# Patient Record
Sex: Female | Born: 1965 | Race: White | Hispanic: No | Smoking: Never smoker
Health system: Southern US, Community
[De-identification: ages and names within clinical notes are randomized; demographics above are authoritative.]

---

## 1998-02-22 ENCOUNTER — Other Ambulatory Visit: Admission: RE | Admit: 1998-02-22 | Discharge: 1998-02-22 | Payer: Self-pay | Admitting: *Deleted

## 2000-05-30 ENCOUNTER — Other Ambulatory Visit: Admission: RE | Admit: 2000-05-30 | Discharge: 2000-05-30 | Payer: Self-pay | Admitting: Gynecology

## 2001-06-09 ENCOUNTER — Other Ambulatory Visit: Admission: RE | Admit: 2001-06-09 | Discharge: 2001-06-09 | Payer: Self-pay | Admitting: Gynecology

## 2003-03-30 ENCOUNTER — Other Ambulatory Visit: Admission: RE | Admit: 2003-03-30 | Discharge: 2003-03-30 | Payer: Self-pay | Admitting: Gynecology

## 2006-11-11 ENCOUNTER — Other Ambulatory Visit: Admission: RE | Admit: 2006-11-11 | Discharge: 2006-11-11 | Payer: Self-pay | Admitting: Gynecology

## 2007-01-26 ENCOUNTER — Encounter: Admission: RE | Admit: 2007-01-26 | Discharge: 2007-01-26 | Payer: Self-pay | Admitting: Gynecology

## 2007-04-06 ENCOUNTER — Emergency Department (HOSPITAL_COMMUNITY): Admission: EM | Admit: 2007-04-06 | Discharge: 2007-04-06 | Payer: Self-pay | Admitting: Emergency Medicine

## 2010-01-01 ENCOUNTER — Encounter: Admission: RE | Admit: 2010-01-01 | Discharge: 2010-01-01 | Payer: Self-pay | Admitting: Gynecology

## 2011-07-31 LAB — POCT URINALYSIS DIP (DEVICE)
Glucose, UA: NEGATIVE
Hgb urine dipstick: NEGATIVE
Ketones, ur: NEGATIVE
Protein, ur: 30 — AB
Urobilinogen, UA: 0.2

## 2015-12-15 ENCOUNTER — Other Ambulatory Visit: Payer: Self-pay | Admitting: Physician Assistant

## 2015-12-15 DIAGNOSIS — M79604 Pain in right leg: Secondary | ICD-10-CM

## 2015-12-19 ENCOUNTER — Ambulatory Visit
Admission: RE | Admit: 2015-12-19 | Discharge: 2015-12-19 | Disposition: A | Discharge status: Home / Self Care | Payer: Commercial Managed Care - PPO | Source: Ambulatory Visit | Attending: Physician Assistant | Admitting: Physician Assistant

## 2015-12-19 DIAGNOSIS — M79604 Pain in right leg: Secondary | ICD-10-CM

## 2016-07-05 ENCOUNTER — Encounter: Payer: Self-pay | Admitting: Family Medicine

## 2017-03-06 DIAGNOSIS — H43393 Other vitreous opacities, bilateral: Secondary | ICD-10-CM | POA: Diagnosis not present

## 2017-07-29 DIAGNOSIS — L821 Other seborrheic keratosis: Secondary | ICD-10-CM | POA: Diagnosis not present

## 2017-07-29 DIAGNOSIS — B078 Other viral warts: Secondary | ICD-10-CM | POA: Diagnosis not present

## 2017-07-29 DIAGNOSIS — Z23 Encounter for immunization: Secondary | ICD-10-CM | POA: Diagnosis not present

## 2017-07-29 DIAGNOSIS — D225 Melanocytic nevi of trunk: Secondary | ICD-10-CM | POA: Diagnosis not present

## 2017-08-28 DIAGNOSIS — Z0000 Encounter for general adult medical examination without abnormal findings: Secondary | ICD-10-CM | POA: Diagnosis not present

## 2017-08-28 DIAGNOSIS — Z131 Encounter for screening for diabetes mellitus: Secondary | ICD-10-CM | POA: Diagnosis not present

## 2017-08-28 DIAGNOSIS — M7989 Other specified soft tissue disorders: Secondary | ICD-10-CM | POA: Diagnosis not present

## 2017-08-28 DIAGNOSIS — Z13220 Encounter for screening for lipoid disorders: Secondary | ICD-10-CM | POA: Diagnosis not present

## 2017-08-28 DIAGNOSIS — Z23 Encounter for immunization: Secondary | ICD-10-CM | POA: Diagnosis not present

## 2017-08-29 ENCOUNTER — Encounter: Payer: Self-pay | Admitting: Gastroenterology

## 2017-09-15 DIAGNOSIS — B078 Other viral warts: Secondary | ICD-10-CM | POA: Diagnosis not present

## 2017-10-16 ENCOUNTER — Other Ambulatory Visit: Payer: Self-pay

## 2017-10-16 DIAGNOSIS — I83813 Varicose veins of bilateral lower extremities with pain: Secondary | ICD-10-CM

## 2017-10-23 ENCOUNTER — Encounter: Payer: Commercial Managed Care - PPO | Admitting: Gastroenterology

## 2017-11-06 ENCOUNTER — Encounter: Payer: Self-pay | Admitting: Family Medicine

## 2017-12-09 ENCOUNTER — Ambulatory Visit: Payer: Commercial Managed Care - PPO | Admitting: Vascular Surgery

## 2017-12-09 ENCOUNTER — Ambulatory Visit (HOSPITAL_COMMUNITY)
Admission: RE | Admit: 2017-12-09 | Discharge: 2017-12-09 | Disposition: A | Discharge status: Home / Self Care | Payer: Commercial Managed Care - PPO | Source: Ambulatory Visit | Attending: Vascular Surgery | Admitting: Vascular Surgery

## 2017-12-09 ENCOUNTER — Encounter: Payer: Self-pay | Admitting: Vascular Surgery

## 2017-12-09 VITALS — BP 141/75 | HR 84 | Resp 18 | Ht 69.0 in | Wt 164.0 lb

## 2017-12-09 DIAGNOSIS — M7989 Other specified soft tissue disorders: Secondary | ICD-10-CM | POA: Diagnosis not present

## 2017-12-09 DIAGNOSIS — I83813 Varicose veins of bilateral lower extremities with pain: Secondary | ICD-10-CM | POA: Insufficient documentation

## 2017-12-09 DIAGNOSIS — R600 Localized edema: Secondary | ICD-10-CM | POA: Insufficient documentation

## 2017-12-09 NOTE — Progress Notes (Signed)
Subjective:     Patient ID: Sheri Maxwell, female   DOB: 1966-02-18, 52 y.o.   MRN: [id number]  HPI This 52 year old female was referred for evaluation of bilateral ankle swelling and tenderness. She states that this has been present for about 10 years. She has no history of DVT thrombophlebitis stasis ulcers or bleeding. She denies any varicose veins. She states that she does have a bulge behind her right thigh which is not tender and has been present for 15 years which she thinks is a vein. She does not have pain in her lower extremities but her ankles are sensitive to touch.  History reviewed. No pertinent past medical history.  Social History   Tobacco Use  . Smoking status: Never Smoker  . Smokeless tobacco: Never Used  Substance Use Topics  . Alcohol use: Yes    Family History  Problem Relation Age of Onset  . Heart disease Mother     Not on File  No current outpatient medications on file.  Vitals:   12/09/17 1353  BP: (!) 141/75  Pulse: 84  Resp: 18  SpO2: 99%  Weight: 164 lb (74.4 kg)  Height: 5\' 9"  (1.753 m)    Body mass index is 24.22 kg/m.         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 All other systems are completely negative this healthy patient    Objective:   Physical Exam BP (!) 141/75 (BP Location: Left Arm, Patient Position: Sitting, Cuff Size: Normal)   Pulse 84   Resp 18   Ht 5\' 9"  (1.753 m)   Wt 164 lb (74.4 kg)   SpO2 99%   BMI 24.22 kg/m     Gen.-alert and oriented x3 in no apparent distress HEENT normal for age Lungs no rhonchi or wheezing Cardiovascular regular rhythm no murmurs carotid pulses 3+ palpable no bruits audible Abdomen soft nontender no palpable masses Musculoskeletal free of  major deformities Skin clear -no rashes Neurologic normal Lower extremities 3+ femoral and dorsalis pedis pulses palpable bilaterally with no edema noted Small nodule palpable in distal thigh  right posteriorly which does not appear to be a varicose vein. It is nontender. Surveillance with the ultrasound did not reveal a lumen or the appearance of a varicosity. No hyperpigmentation or ulceration noted bilaterally and no bulging varicosities noted.  Today our venous duplex exam both lower extremities which I reviewed and interpreted. There is no DVT. There are a few areas of mild reflux in the superficial system but no large caliber vein accompanies this and it is not felt to be pertinent       Assessment:     Complaint of mild bilateral ankle edema and tenderness-etiology unknown No evidence of significant arterial or venous insufficiency History of ruptured cervical disc requiring surgery    Plan:     Patient does not have objective findings of edema on physical exam and do not know the etiology of her sensitivity in the ankle area No indication for any arterial or venous intervention or further evaluation No further suggestions

## 2017-12-10 ENCOUNTER — Encounter: Payer: Self-pay | Admitting: Family Medicine

## 2017-12-24 DIAGNOSIS — L821 Other seborrheic keratosis: Secondary | ICD-10-CM | POA: Diagnosis not present

## 2017-12-24 DIAGNOSIS — L814 Other melanin hyperpigmentation: Secondary | ICD-10-CM | POA: Diagnosis not present

## 2017-12-24 DIAGNOSIS — D1801 Hemangioma of skin and subcutaneous tissue: Secondary | ICD-10-CM | POA: Diagnosis not present

## 2018-06-24 ENCOUNTER — Other Ambulatory Visit: Payer: Self-pay | Admitting: Family Medicine

## 2018-06-24 DIAGNOSIS — Z1231 Encounter for screening mammogram for malignant neoplasm of breast: Secondary | ICD-10-CM

## 2018-07-14 DIAGNOSIS — L821 Other seborrheic keratosis: Secondary | ICD-10-CM | POA: Diagnosis not present

## 2018-07-14 DIAGNOSIS — D485 Neoplasm of uncertain behavior of skin: Secondary | ICD-10-CM | POA: Diagnosis not present

## 2018-07-14 DIAGNOSIS — Z23 Encounter for immunization: Secondary | ICD-10-CM | POA: Diagnosis not present

## 2018-07-21 ENCOUNTER — Ambulatory Visit: Payer: Commercial Managed Care - PPO

## 2018-07-23 ENCOUNTER — Ambulatory Visit
Admission: RE | Admit: 2018-07-23 | Discharge: 2018-07-23 | Disposition: A | Discharge status: Home / Self Care | Payer: Commercial Managed Care - PPO | Source: Ambulatory Visit | Attending: Family Medicine | Admitting: Family Medicine

## 2018-07-23 DIAGNOSIS — Z1231 Encounter for screening mammogram for malignant neoplasm of breast: Secondary | ICD-10-CM | POA: Diagnosis not present

## 2018-11-24 DIAGNOSIS — L609 Nail disorder, unspecified: Secondary | ICD-10-CM | POA: Diagnosis not present

## 2018-11-24 DIAGNOSIS — L603 Nail dystrophy: Secondary | ICD-10-CM | POA: Diagnosis not present

## 2018-11-24 DIAGNOSIS — Z23 Encounter for immunization: Secondary | ICD-10-CM | POA: Diagnosis not present

## 2019-03-23 ENCOUNTER — Other Ambulatory Visit: Payer: Self-pay | Admitting: Family Medicine

## 2019-03-23 ENCOUNTER — Other Ambulatory Visit (HOSPITAL_COMMUNITY)
Admission: RE | Admit: 2019-03-23 | Discharge: 2019-03-23 | Disposition: A | Discharge status: Home / Self Care | Payer: Commercial Managed Care - PPO | Source: Ambulatory Visit | Attending: Family Medicine | Admitting: Family Medicine

## 2019-03-23 DIAGNOSIS — Z124 Encounter for screening for malignant neoplasm of cervix: Secondary | ICD-10-CM | POA: Insufficient documentation

## 2019-03-26 LAB — CYTOLOGY - PAP: Diagnosis: NEGATIVE

## 2021-06-12 ENCOUNTER — Encounter: Payer: Self-pay | Admitting: Podiatry

## 2021-06-12 ENCOUNTER — Ambulatory Visit: Payer: Commercial Managed Care - PPO | Admitting: Podiatry

## 2021-06-12 ENCOUNTER — Telehealth: Payer: Self-pay

## 2021-06-12 ENCOUNTER — Other Ambulatory Visit: Payer: Self-pay

## 2021-06-12 DIAGNOSIS — L309 Dermatitis, unspecified: Secondary | ICD-10-CM

## 2021-06-12 DIAGNOSIS — W57XXXS Bitten or stung by nonvenomous insect and other nonvenomous arthropods, sequela: Secondary | ICD-10-CM | POA: Diagnosis not present

## 2021-06-12 DIAGNOSIS — N951 Menopausal and female climacteric states: Secondary | ICD-10-CM | POA: Insufficient documentation

## 2021-06-12 DIAGNOSIS — N912 Amenorrhea, unspecified: Secondary | ICD-10-CM | POA: Insufficient documentation

## 2021-06-12 DIAGNOSIS — B351 Tinea unguium: Secondary | ICD-10-CM | POA: Diagnosis not present

## 2021-06-12 MED ORDER — TERBINAFINE HCL 250 MG PO TABS: 250.0000 mg | ORAL_TABLET | Freq: Every day | ORAL | 0 refills | Status: DC

## 2021-06-12 MED ORDER — FLUOCINONIDE 0.05 % EX OINT: 1.0000 "application " | TOPICAL_OINTMENT | Freq: Two times a day (BID) | CUTANEOUS | 0 refills | Status: AC

## 2021-06-12 MED ORDER — EFINACONAZOLE 10 % EX SOLN: 1.0000 [drp] | Freq: Every day | CUTANEOUS | 11 refills | Status: DC

## 2021-06-12 NOTE — Telephone Encounter (Signed)
Toenail specimen mailed to [facility] for fungal culture

## 2021-06-13 NOTE — Progress Notes (Signed)
  Subjective:  Patient ID: Sheri Maxwell, female    DOB: 04/17/1966,  MRN: [id number]  Chief Complaint  Patient presents with   Nail Problem    (np) dsicoloration of toenail     55 y.o. female presents with the above complaint. History confirmed with patient.  She has loosening of both nail plates and discoloration, she had a culture done at another practice and was told there is no nail fungus in there and could not treat this.  She also has 2 spots on the outside of her left foot near the fourth and fifth toes from fire ant bites from a wedding  Objective:  Physical Exam: warm, good capillary refill, no trophic changes or ulcerative lesions, normal DP and PT pulses, and normal sensory exam.  Bilateral hallux nail has yellow discoloration, brown subungual debris and onycholysis.  Clinically appears consistent with onychomycosis.  2 small hyperpigmented scars on around the fourth and fifth toes left foot, nonraised nontender nondraining     Assessment:   1. Onychomycosis   2. Dermatitis   3. Arthropod bite, sequela      Plan:  Patient was evaluated and treated and all questions answered.  Discussed etiology and treatment options for both onycholysis onychodystrophy and onychomycosis.  I recommended repeat culture of the nail plate today with a good sample from the most proximal portion of the raised portion of the subungual debris on the right hallux.  Clinically it does appear to be consistent with onychomycosis and I think it would be worth treating empirically with terbinafine which I prescribed for her.  Also recommended dual therapy with Jublia as well which I also prescribed.  Photographs are taken.  The bug bites are persistent but are minimally painful.  I recommended topical corticosteroid and this should alleviate this.  Rx for fluocinonide ointment sent to pharmacy  Return in about 4 months (around 10/12/2021) for follow up after nail fungus treatment.

## 2021-06-15 ENCOUNTER — Telehealth: Payer: Self-pay | Admitting: Podiatry

## 2021-06-15 NOTE — Telephone Encounter (Signed)
Patient said the Efinaconazole is being denied by her insurance. Is there something else you can call in?

## 2021-06-19 MED ORDER — CICLOPIROX 8 % EX SOLN
Freq: Every day | CUTANEOUS | 0 refills | Status: AC
Start: 2021-06-19 — End: ?

## 2021-06-19 NOTE — Addendum Note (Signed)
Addended bySherryle Lis, Abrian Hanover R on: 06/19/2021 05:47 PM   Modules accepted: Orders

## 2021-07-17 ENCOUNTER — Telehealth: Payer: Self-pay | Admitting: *Deleted

## 2021-07-17 NOTE — Telephone Encounter (Signed)
Patient is calling and wanted to know if she should refill the Terbinafine -250 mg tablet or just the topical before f/u upcoming appointment(10/23/21)? Please advise.

## 2021-07-19 NOTE — Telephone Encounter (Signed)
Returned call to patient,no answer, left message per Dr Maxie Barb recommendations to continue with both medication,90 days total.

## 2021-09-27 ENCOUNTER — Other Ambulatory Visit: Payer: Self-pay | Admitting: Podiatry

## 2021-09-27 DIAGNOSIS — B351 Tinea unguium: Secondary | ICD-10-CM

## 2021-09-27 NOTE — Telephone Encounter (Signed)
Please advise 

## 2021-10-23 ENCOUNTER — Other Ambulatory Visit: Payer: Self-pay

## 2021-10-23 ENCOUNTER — Telehealth: Payer: Self-pay | Admitting: *Deleted

## 2021-10-23 ENCOUNTER — Other Ambulatory Visit: Payer: Self-pay | Admitting: Podiatry

## 2021-10-23 ENCOUNTER — Ambulatory Visit: Payer: Commercial Managed Care - PPO | Admitting: Podiatry

## 2021-10-23 DIAGNOSIS — B351 Tinea unguium: Secondary | ICD-10-CM | POA: Diagnosis not present

## 2021-10-23 MED ORDER — JUBLIA 10 % EX SOLN: 1.0000 [drp] | Freq: Every day | CUTANEOUS | 5 refills | Status: DC

## 2021-10-23 MED ORDER — JUBLIA 10 % EX SOLN: 1.0000 [drp] | Freq: Every day | CUTANEOUS | 5 refills | Status: AC

## 2021-10-23 MED ORDER — TERBINAFINE HCL 250 MG PO TABS: ORAL_TABLET | ORAL | 0 refills | Status: DC

## 2021-10-23 NOTE — Telephone Encounter (Signed)
Patient is calling because she would like the Jublia sent to [facility] instead of [facility] and terbinafine is not able to be filled yet according pharmacy.

## 2021-10-23 NOTE — Telephone Encounter (Signed)
Resending it now

## 2021-10-25 ENCOUNTER — Encounter: Payer: Self-pay | Admitting: Podiatry

## 2021-10-25 NOTE — Progress Notes (Signed)
°  Subjective:  Patient ID: Sheri Maxwell, female    DOB: February 07, 1966,  MRN: [id number]  Chief Complaint  Patient presents with   Nail Problem     F/U nail fungus -pt states," getting bette, but not 100%, still there and not growing properly." - Tx: terbinafine, and jublia ( no reactions) -no redness/swelling/pain    Insect Bite    F/U LT bug bit -pt states much better, and less redness tx: fluocinonide     56 y.o. female returns for follow-up with the above complaint. History confirmed with patient.  She has had quite a bit of improvement with Jublia and terbinafine  Objective:  Physical Exam: warm, good capillary refill, no trophic changes or ulcerative lesions, normal DP and PT pulses, and normal sensory exam.  At last visit the nail was involved 50% now less than 30%        Assessment:   1. Onychomycosis      Plan:  Patient was evaluated and treated and all questions answered.  Has had quite a bit of improvement Lamisil and Jublia applications.  I think we should continue therapy at this point to eradicate this.  Further refills were sent to her pharmacy.  New photographs were taken.  I will see her back in 4 months.  No follow-ups on file.

## 2021-10-29 NOTE — Telephone Encounter (Signed)
Notified patient thru vmessage.

## 2021-11-16 ENCOUNTER — Other Ambulatory Visit: Payer: Self-pay | Admitting: Family Medicine

## 2021-11-16 DIAGNOSIS — Z1231 Encounter for screening mammogram for malignant neoplasm of breast: Secondary | ICD-10-CM

## 2021-11-23 ENCOUNTER — Encounter: Payer: Self-pay | Admitting: Podiatry

## 2021-11-27 ENCOUNTER — Ambulatory Visit
Admission: RE | Admit: 2021-11-27 | Discharge: 2021-11-27 | Disposition: A | Discharge status: Home / Self Care | Payer: Commercial Managed Care - PPO | Source: Ambulatory Visit | Attending: Family Medicine | Admitting: Family Medicine

## 2021-11-27 ENCOUNTER — Other Ambulatory Visit: Payer: Self-pay

## 2021-11-27 DIAGNOSIS — Z1231 Encounter for screening mammogram for malignant neoplasm of breast: Secondary | ICD-10-CM

## 2022-01-11 ENCOUNTER — Other Ambulatory Visit: Payer: Self-pay | Admitting: Podiatry

## 2022-01-11 DIAGNOSIS — B351 Tinea unguium: Secondary | ICD-10-CM

## 2022-02-12 ENCOUNTER — Ambulatory Visit: Payer: Commercial Managed Care - PPO | Admitting: Podiatry

## 2022-02-12 DIAGNOSIS — B351 Tinea unguium: Secondary | ICD-10-CM

## 2022-02-12 MED ORDER — TERBINAFINE HCL 250 MG PO TABS: ORAL_TABLET | ORAL | 0 refills | Status: AC

## 2022-02-13 NOTE — Progress Notes (Signed)
?  Subjective:  ?Patient ID: Sheri Maxwell, female    DOB: 07-21-1966,  MRN: [id number] ? ?Chief Complaint  ?Patient presents with  ? Nail Problem  ?  follow up after nail fungus treatment.  ? ? ?56 y.o. female returns for follow-up with the above complaint. History confirmed with patient.  She has had quite a bit of improvement with Penlac and terbinafine ? ?Objective:  ?Physical Exam: ?warm, good capillary refill, no trophic changes or ulcerative lesions, normal DP and PT pulses, and normal sensory exam.  Approximate 20% involvement ? ?Assessment:  ? ?1. Onychomycosis   ? ? ? ?Plan:  ?Patient was evaluated and treated and all questions answered. ? ?Has had quite a bit of improvement Lamisil and Jublia applications.  I think we should continue therapy at this point to eradicate this.  Refill of the terbinafine was sent.  We also discussed laser treatment and she will be scheduled for this as well. ? ?Return in about 4 months (around 06/15/2022) for follow up after nail fungus treatment.  ? ? ?

## 2022-03-08 ENCOUNTER — Other Ambulatory Visit: Payer: Commercial Managed Care - PPO

## 2022-06-18 ENCOUNTER — Ambulatory Visit: Payer: Commercial Managed Care - PPO | Admitting: Podiatry

## 2022-07-16 ENCOUNTER — Ambulatory Visit: Payer: Commercial Managed Care - PPO | Admitting: Podiatry

## 2022-08-01 ENCOUNTER — Ambulatory Visit: Payer: Commercial Managed Care - PPO | Admitting: Podiatry

## 2022-08-23 ENCOUNTER — Other Ambulatory Visit: Payer: Commercial Managed Care - PPO

## 2022-10-06 IMAGING — MG MM DIGITAL SCREENING BILAT W/ TOMO AND CAD
8 series · 9 of 24 positions shown · non-contrast
Comparison: Previous exam(s).

CLINICAL DATA: Screening.

EXAM:
DIGITAL SCREENING BILATERAL MAMMOGRAM WITH TOMOSYNTHESIS AND CAD
TECHNIQUE: Bilateral screening digital craniocaudal and mediolateral oblique
mammograms were obtained. Bilateral screening digital breast
tomosynthesis was performed. The images were evaluated with
computer-aided detection.

[L CC synth-2D]
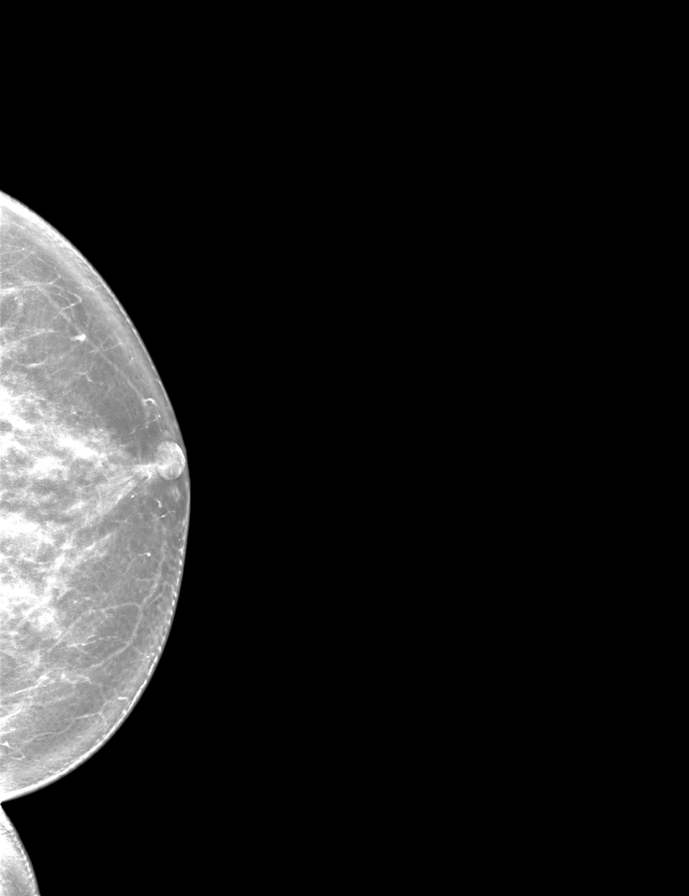

[R CC synth-2D]
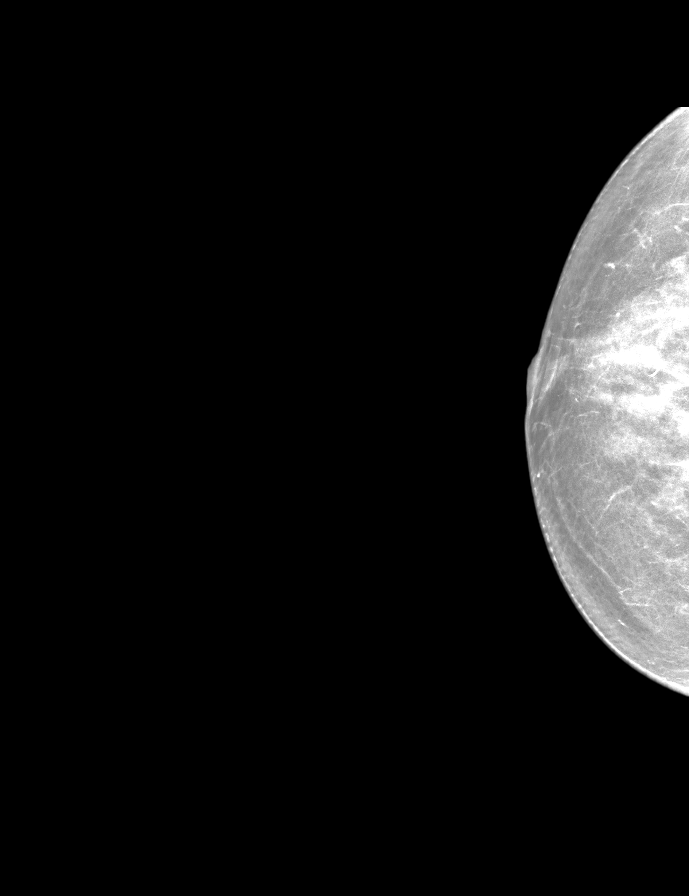

[L MLO synth-2D]
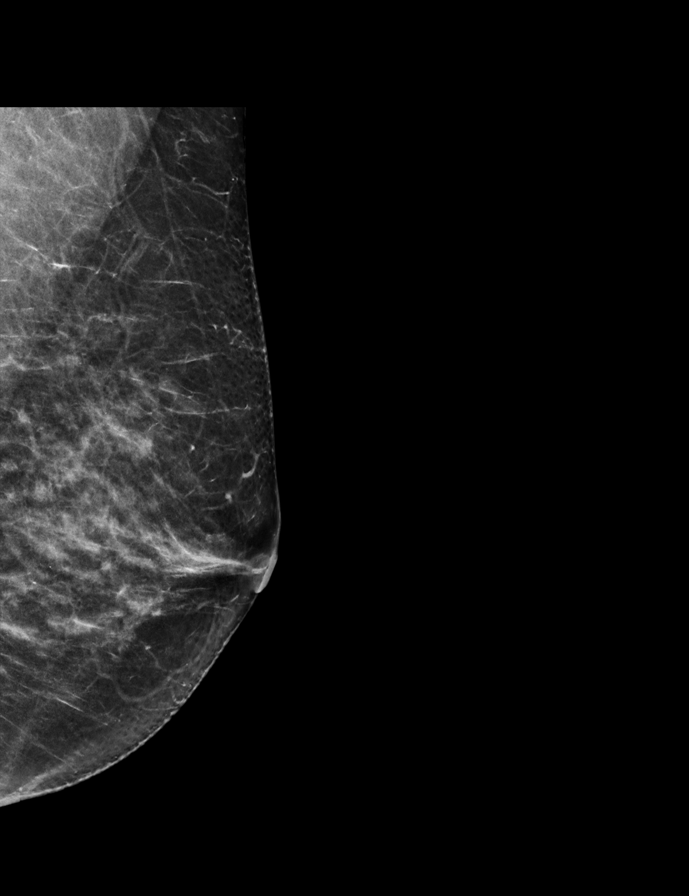

[R MLO synth-2D]
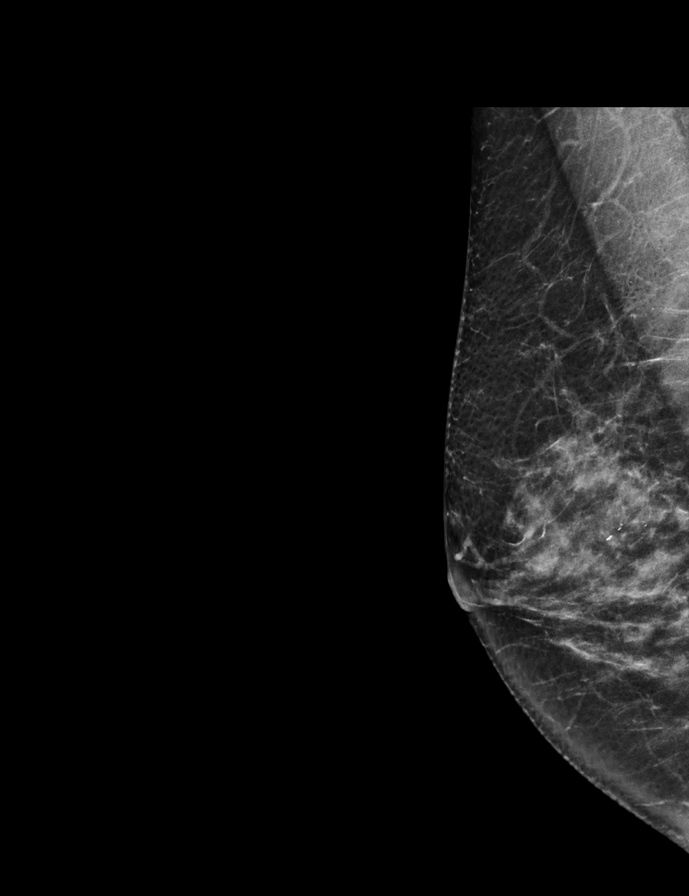

[L MLO tomo · 2 of 63 frames shown]
[frame 21/63]
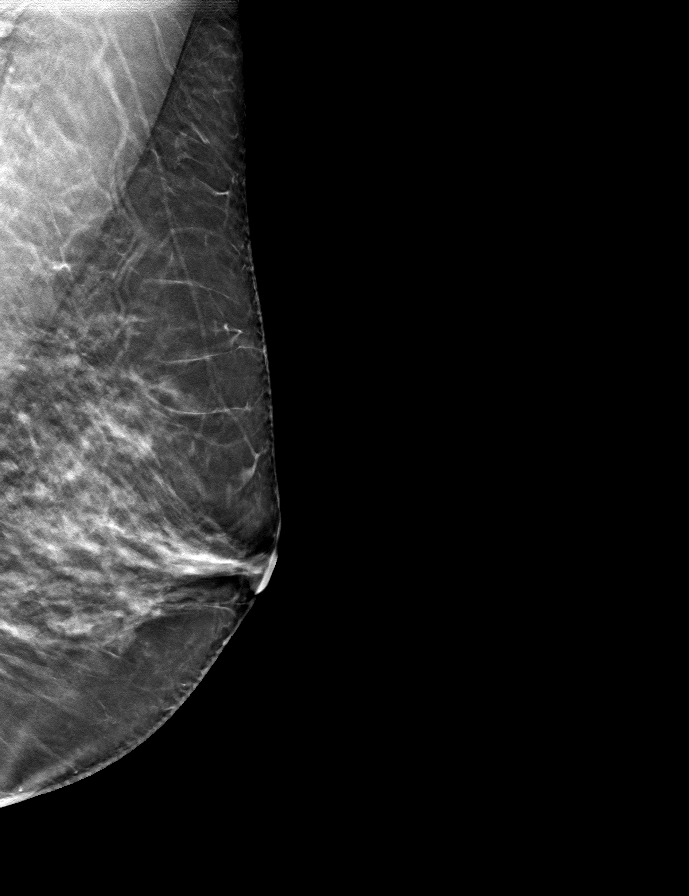
[frame 32/63]
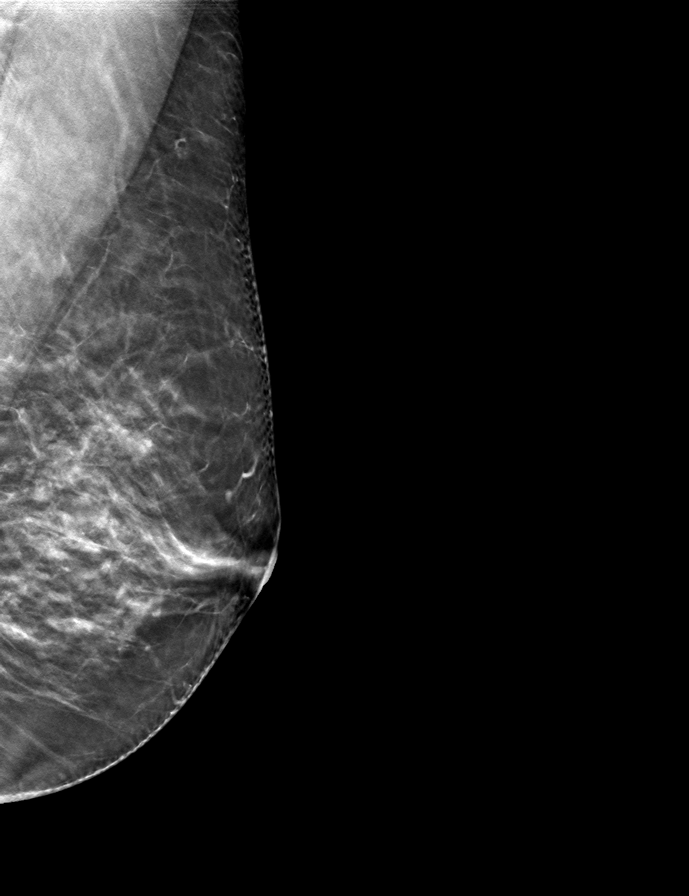

[R MLO tomo · tomo slice 33/65.0]
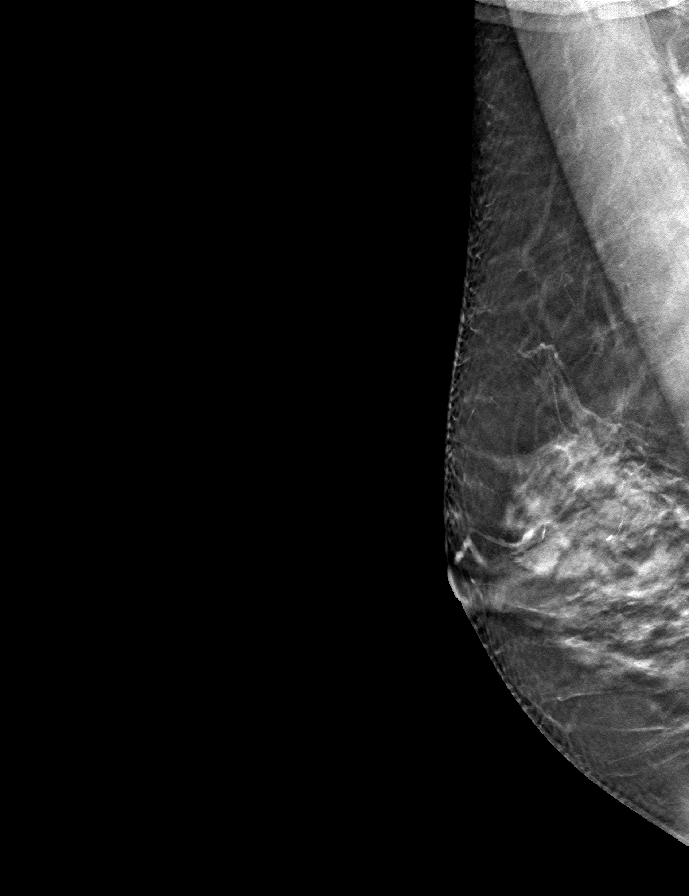

[L CC tomo · tomo slice 33/66.0]
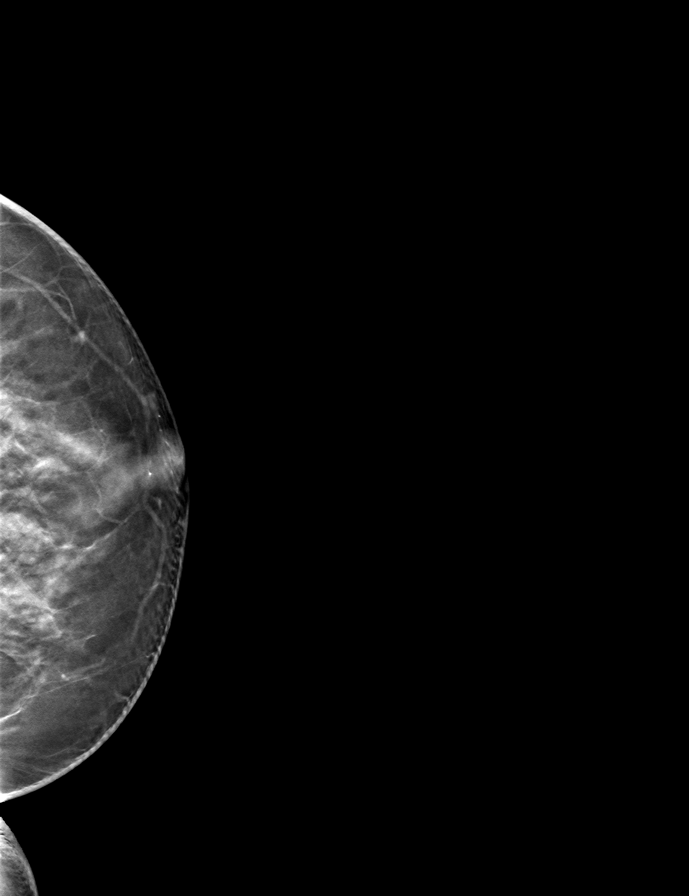

[R CC tomo · tomo slice 31/61.0]
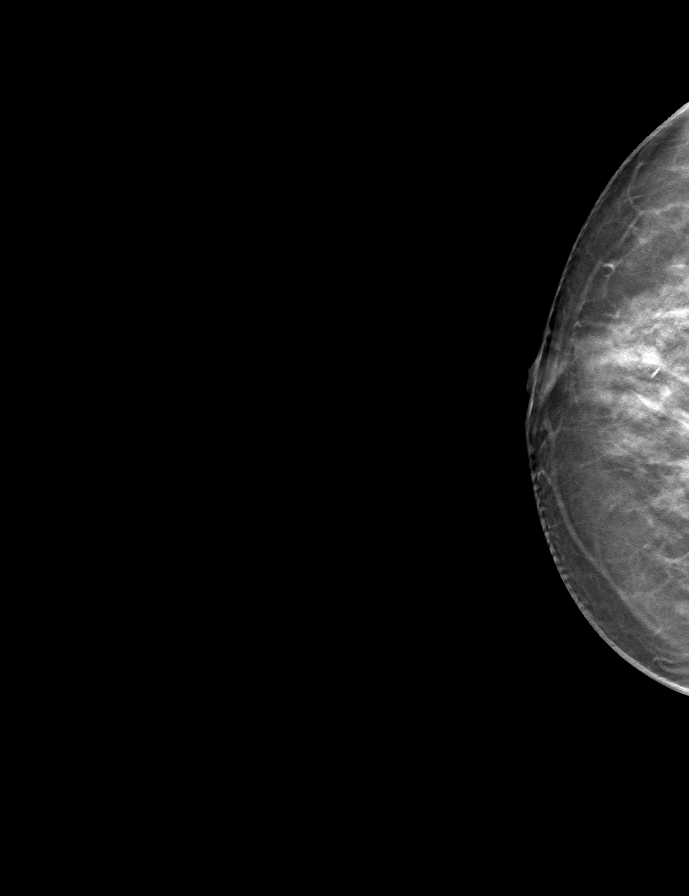

[9 of 24 positions shown; findings below may reference images not displayed]

ACR Breast Density Category d: The breast tissue is extremely dense,
which lowers the sensitivity of mammography
FINDINGS: There are no findings suspicious for malignancy.
IMPRESSION: No mammographic evidence of malignancy. A result letter of this
screening mammogram will be mailed directly to the patient.

RECOMMENDATION:
Screening mammogram in one year. (Code:TA-V-WV9)

BI-RADS CATEGORY  1: Negative.

## 2022-10-29 ENCOUNTER — Other Ambulatory Visit: Payer: Self-pay | Admitting: Internal Medicine

## 2022-10-29 DIAGNOSIS — Z1231 Encounter for screening mammogram for malignant neoplasm of breast: Secondary | ICD-10-CM

## 2022-12-17 ENCOUNTER — Ambulatory Visit: Payer: Commercial Managed Care - PPO

## 2023-12-22 DIAGNOSIS — Z0000 Encounter for general adult medical examination without abnormal findings: Secondary | ICD-10-CM | POA: Diagnosis not present

## 2023-12-22 DIAGNOSIS — E7800 Pure hypercholesterolemia, unspecified: Secondary | ICD-10-CM | POA: Diagnosis not present

## 2023-12-31 DIAGNOSIS — H0014 Chalazion left upper eyelid: Secondary | ICD-10-CM | POA: Diagnosis not present

## 2024-02-10 DIAGNOSIS — Z01419 Encounter for gynecological examination (general) (routine) without abnormal findings: Secondary | ICD-10-CM | POA: Diagnosis not present

## 2024-02-10 DIAGNOSIS — Z1231 Encounter for screening mammogram for malignant neoplasm of breast: Secondary | ICD-10-CM | POA: Diagnosis not present

## 2024-02-10 DIAGNOSIS — Z6825 Body mass index (BMI) 25.0-25.9, adult: Secondary | ICD-10-CM | POA: Diagnosis not present

## 2024-05-17 DIAGNOSIS — L814 Other melanin hyperpigmentation: Secondary | ICD-10-CM | POA: Diagnosis not present

## 2024-05-17 DIAGNOSIS — Z86018 Personal history of other benign neoplasm: Secondary | ICD-10-CM | POA: Diagnosis not present

## 2024-05-17 DIAGNOSIS — L821 Other seborrheic keratosis: Secondary | ICD-10-CM | POA: Diagnosis not present

## 2024-05-17 DIAGNOSIS — L578 Other skin changes due to chronic exposure to nonionizing radiation: Secondary | ICD-10-CM | POA: Diagnosis not present
